# Patient Record
Sex: Female | Born: 1950 | Race: Black or African American | Hispanic: No | Smoking: Former smoker
Health system: Southern US, Community
[De-identification: ages and names within clinical notes are randomized; demographics above are authoritative.]

## PROBLEM LIST (undated history)

## (undated) DIAGNOSIS — E669 Obesity, unspecified: Secondary | ICD-10-CM

## (undated) DIAGNOSIS — E119 Type 2 diabetes mellitus without complications: Secondary | ICD-10-CM

## (undated) DIAGNOSIS — M1990 Unspecified osteoarthritis, unspecified site: Secondary | ICD-10-CM

## (undated) DIAGNOSIS — E7800 Pure hypercholesterolemia, unspecified: Secondary | ICD-10-CM

## (undated) DIAGNOSIS — I10 Essential (primary) hypertension: Secondary | ICD-10-CM

## (undated) DIAGNOSIS — L309 Dermatitis, unspecified: Secondary | ICD-10-CM

## (undated) HISTORY — PX: SHOULDER ARTHROTOMY: SUR111

## (undated) HISTORY — PX: TUBAL LIGATION: SHX77

## (undated) HISTORY — PX: TONSILLECTOMY: SUR1361

---

## 2005-10-03 ENCOUNTER — Ambulatory Visit: Payer: Self-pay

## 2005-11-07 ENCOUNTER — Ambulatory Visit: Payer: Self-pay

## 2007-05-12 IMAGING — CR DG KNEE 1-2V*L*
1 series · 4 of 4 positions shown · non-contrast
Comparison: none

REASON FOR EXAM: Pain
                     DDS  Fax report to: [REDACTED]
COMMENTS:

PROCEDURE:     DXR - DXR KNEE LEFT AP AND LATERAL  - October 03, 2005  [DATE]
RESULT:     No acute bony or joint abnormality is identified. There is no
evidence of fracture or dislocation.

[Series 1: view not recorded · 0.17mm/px · 4 of 4 slices shown]
[im 1/4]
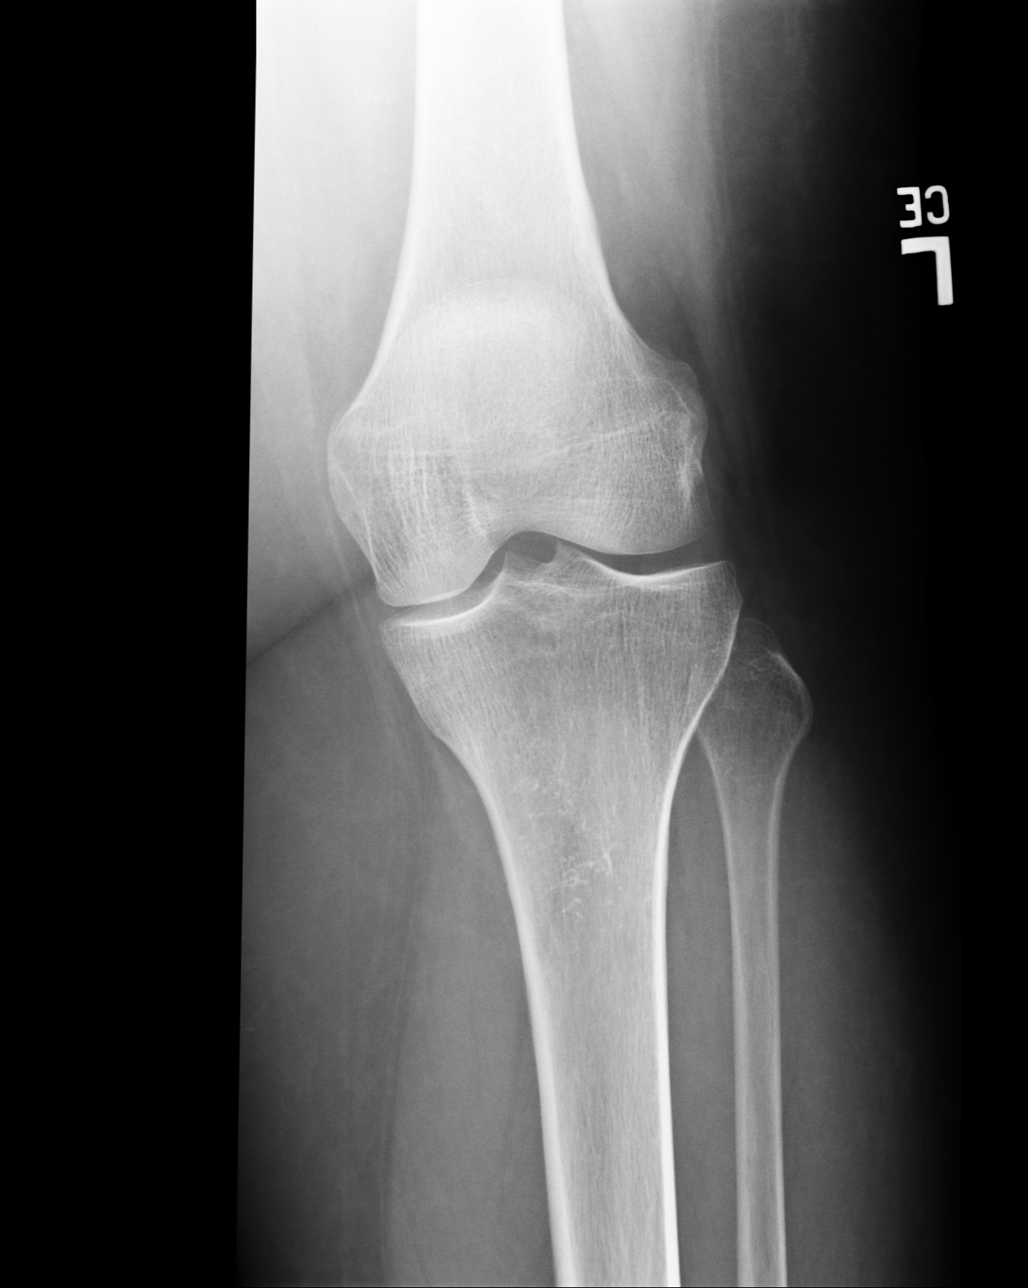
[im 2/4]
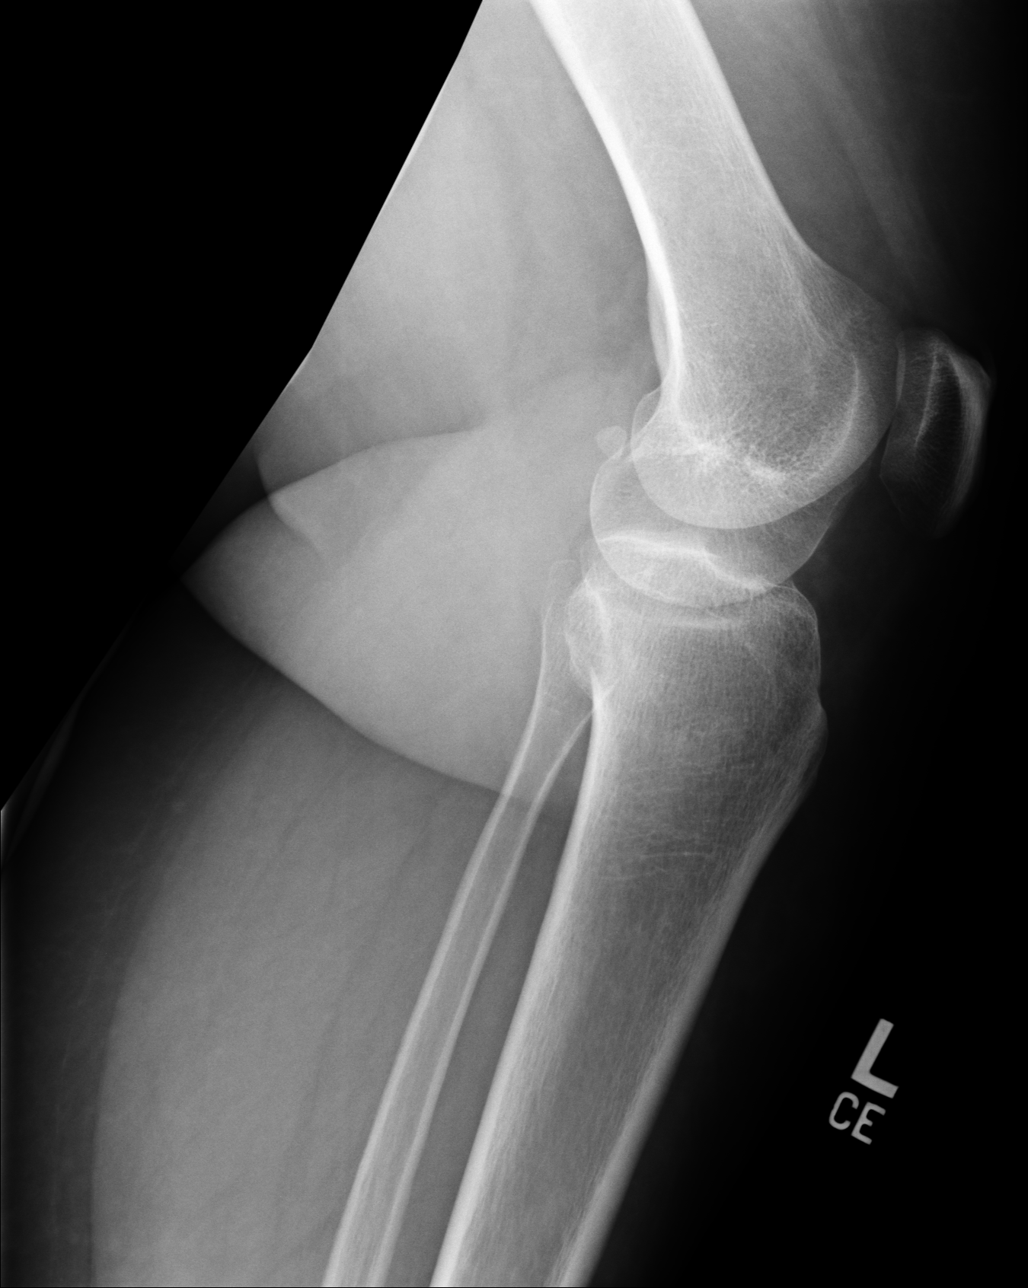
[im 3/4]
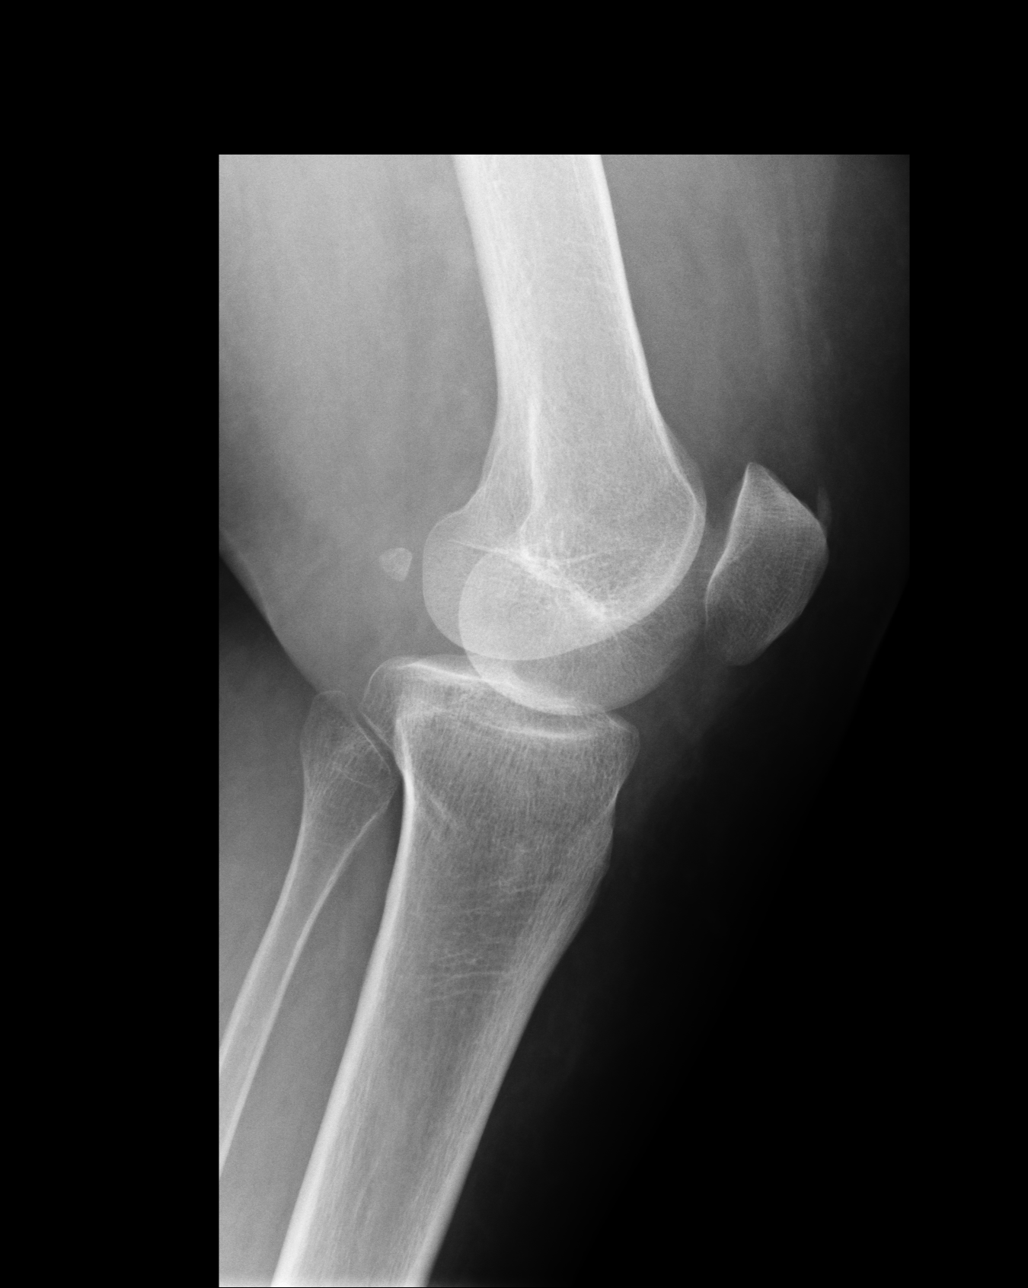
[im 4/4]
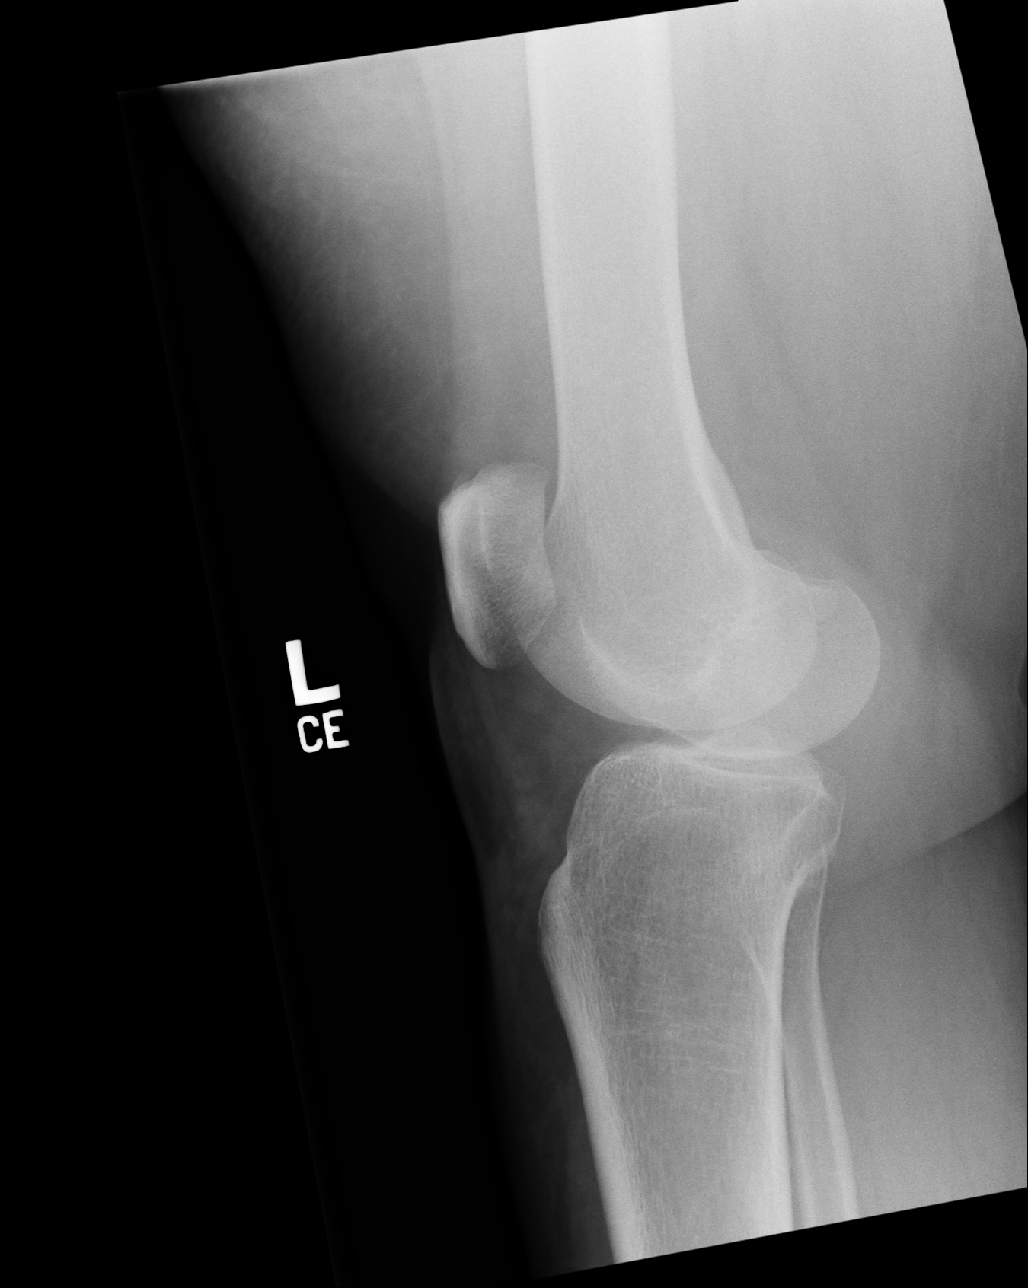

[4 of 4 positions shown; findings below may reference images not displayed]

IMPRESSION: No acute abnormality.

## 2007-06-16 IMAGING — CR RIGHT HIP - COMPLETE 2+ VIEW
1 series · 3 of 3 positions shown · non-contrast
Comparison: none

REASON FOR EXAM: xray right hip pain
COMMENTS:

PROCEDURE:     DXR - DXR HIP RIGHT COMPLETE  - November 07, 2005 [DATE]
RESULT:     AP and lateral views of the RIGHT hip show no fracture,
dislocation or other acute bony abnormality.

[Series 1: view not recorded · 0.17mm/px · 3 of 3 slices shown]
[im 1/3]
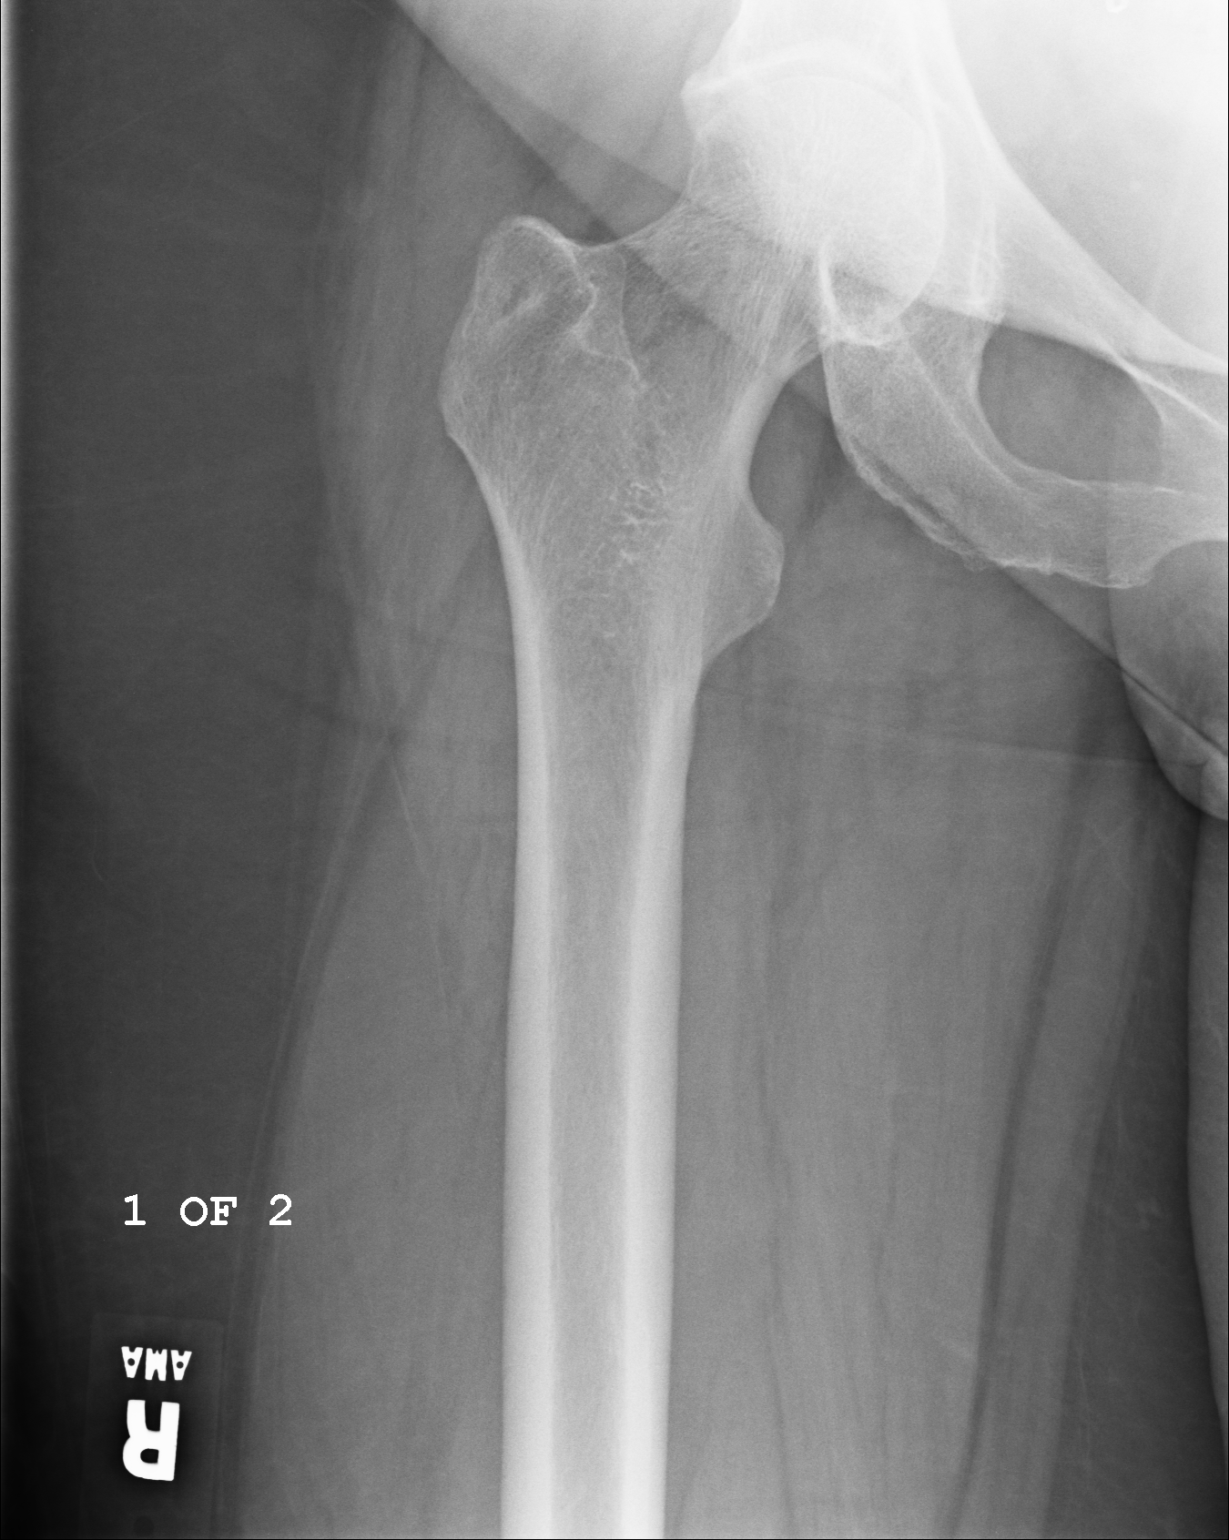
[im 2/3]
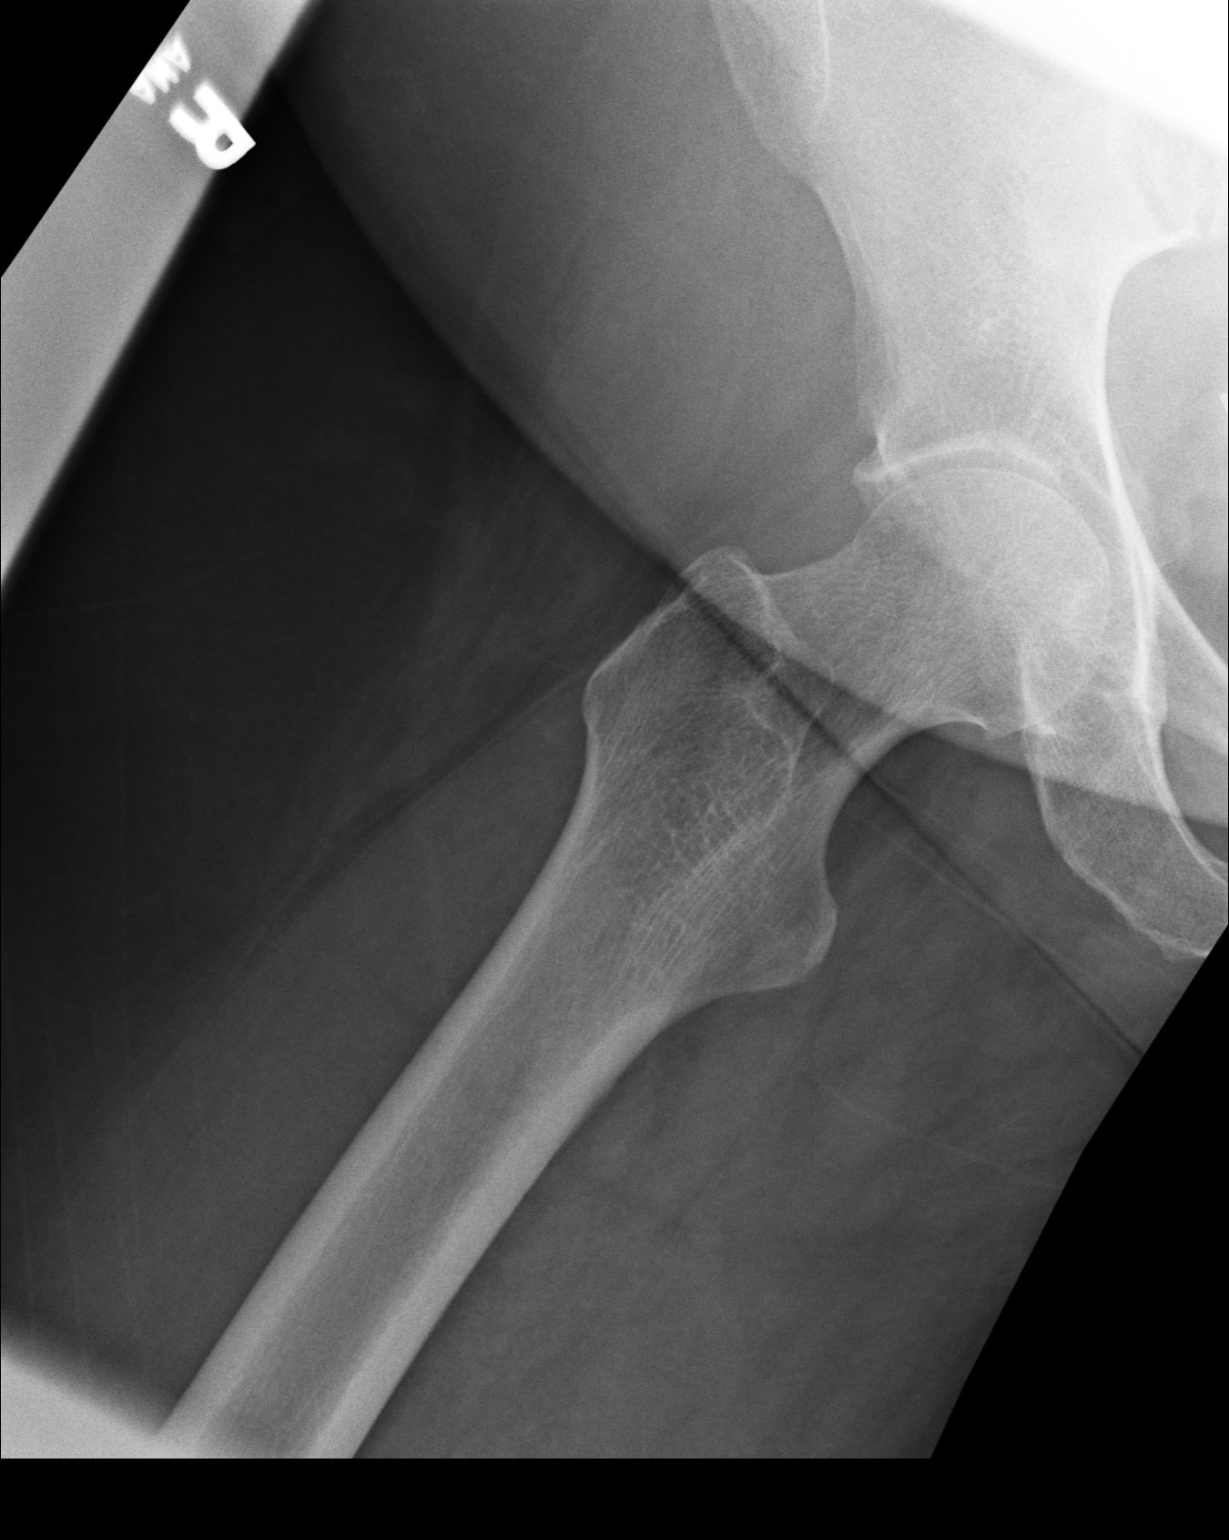
[im 3/3]
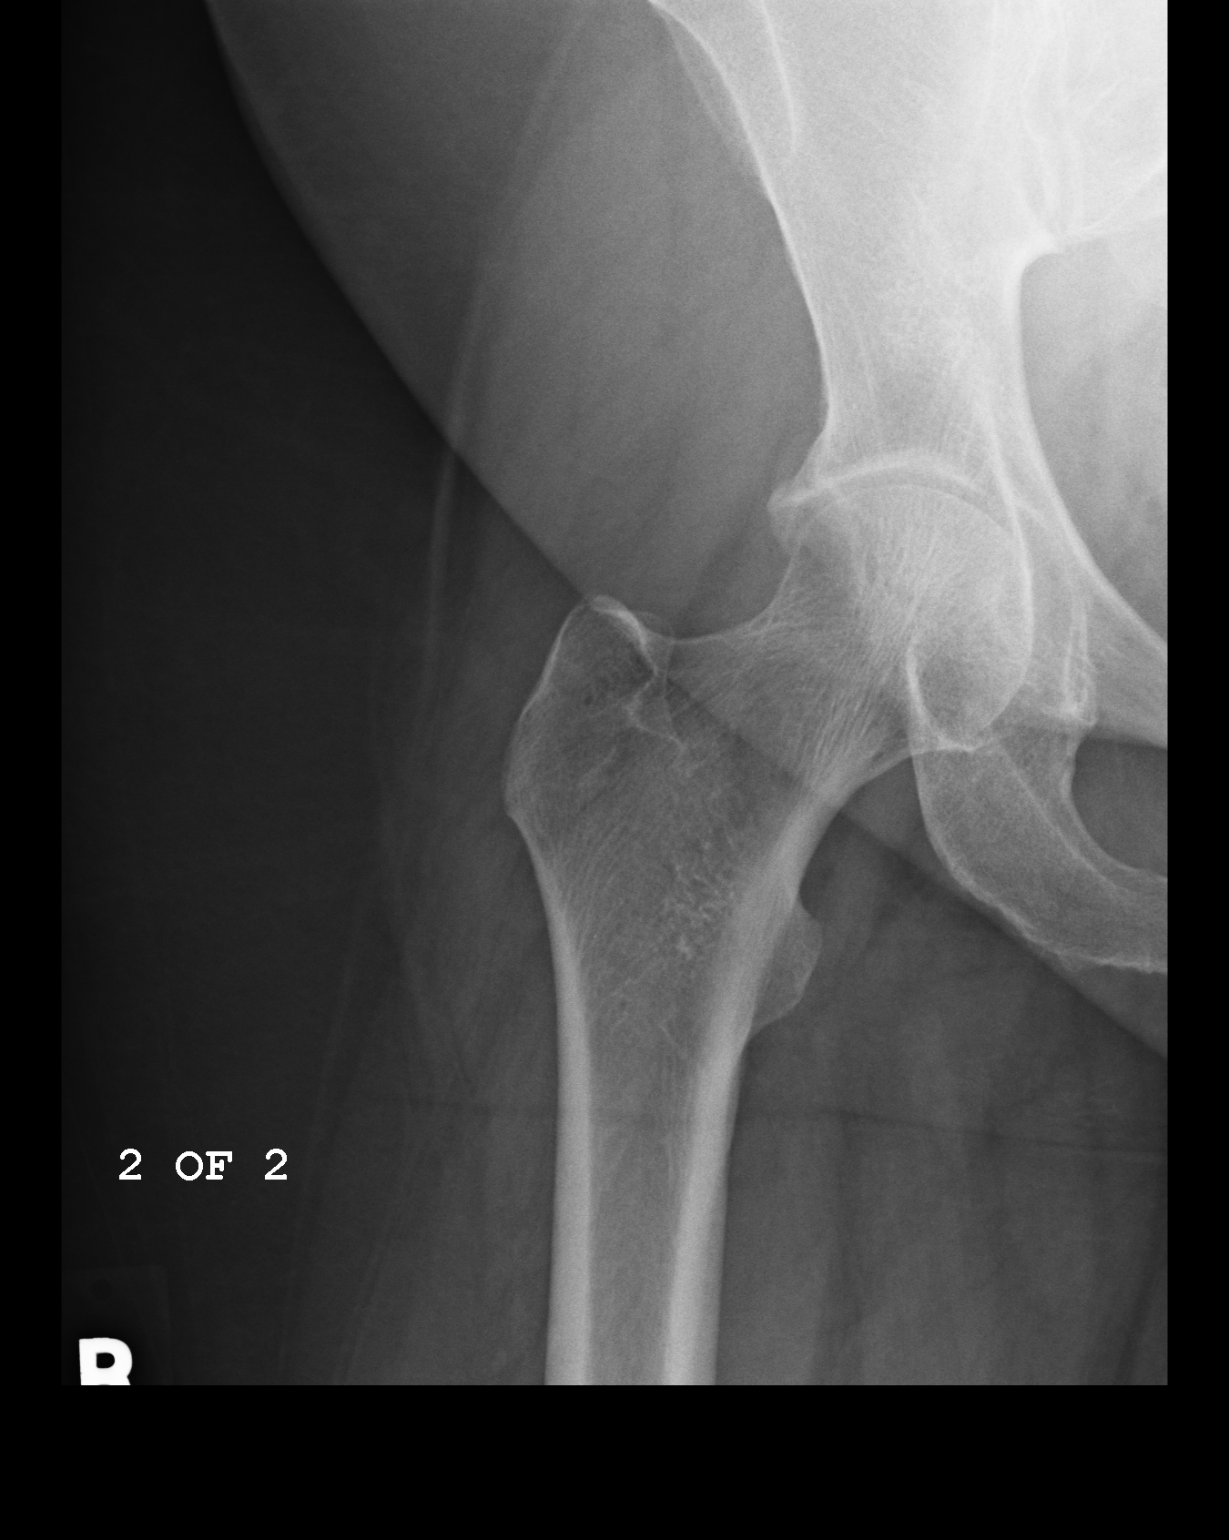

[3 of 3 positions shown; findings below may reference images not displayed]

IMPRESSION: 1)No significant osseous abnormalities are identified.

## 2007-06-16 IMAGING — CR DG HIP COMPLETE 2+V*L*
1 series · 2 of 2 positions shown · non-contrast
Comparison: none

REASON FOR EXAM: Left hip pain problem  dds [phone] ext
[id number]
COMMENTS:

PROCEDURE:     DXR - DXR HIP LEFT COMPLETE  - November 07, 2005 [DATE]
RESULT:        Two views of the LEFT hip show no fracture, dislocation or
other acute bony abnormality. The hip joint space is well maintained.  No
lytic or blastic lesions are seen.

[Series 1: view not recorded · 0.17mm/px · 2 of 2 slices shown]
[im 1/2]
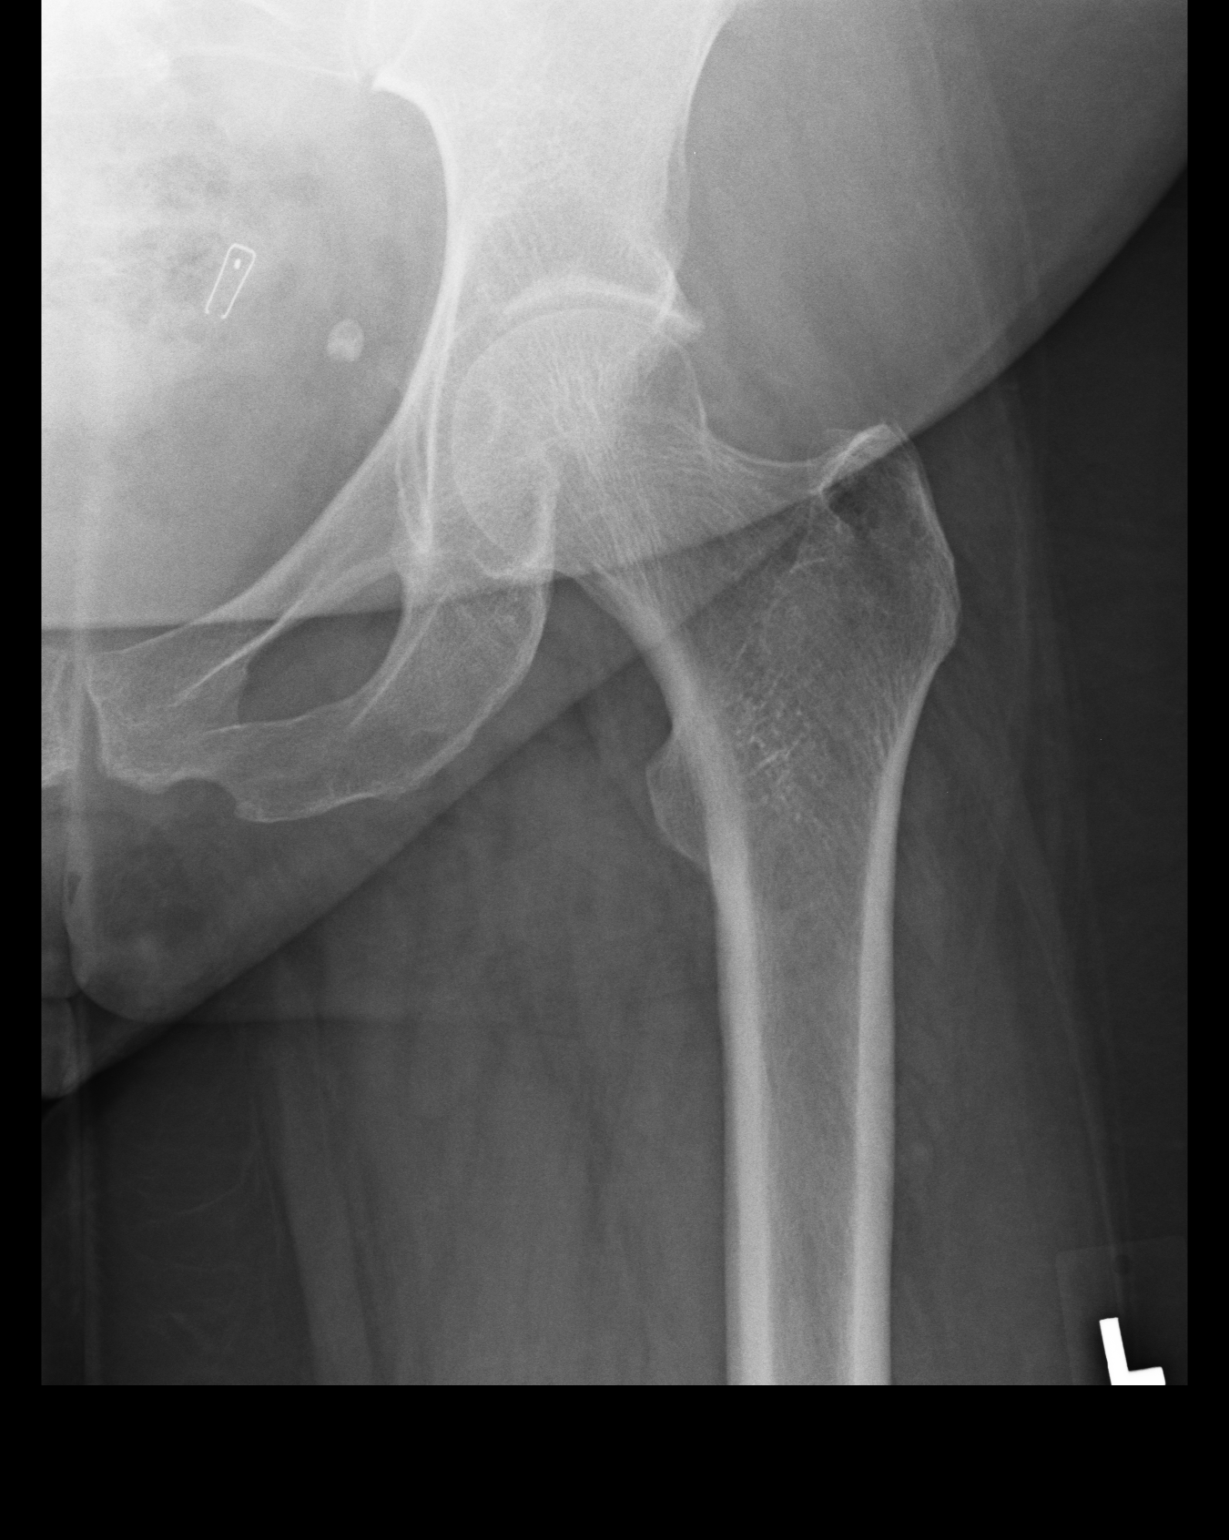
[im 2/2]
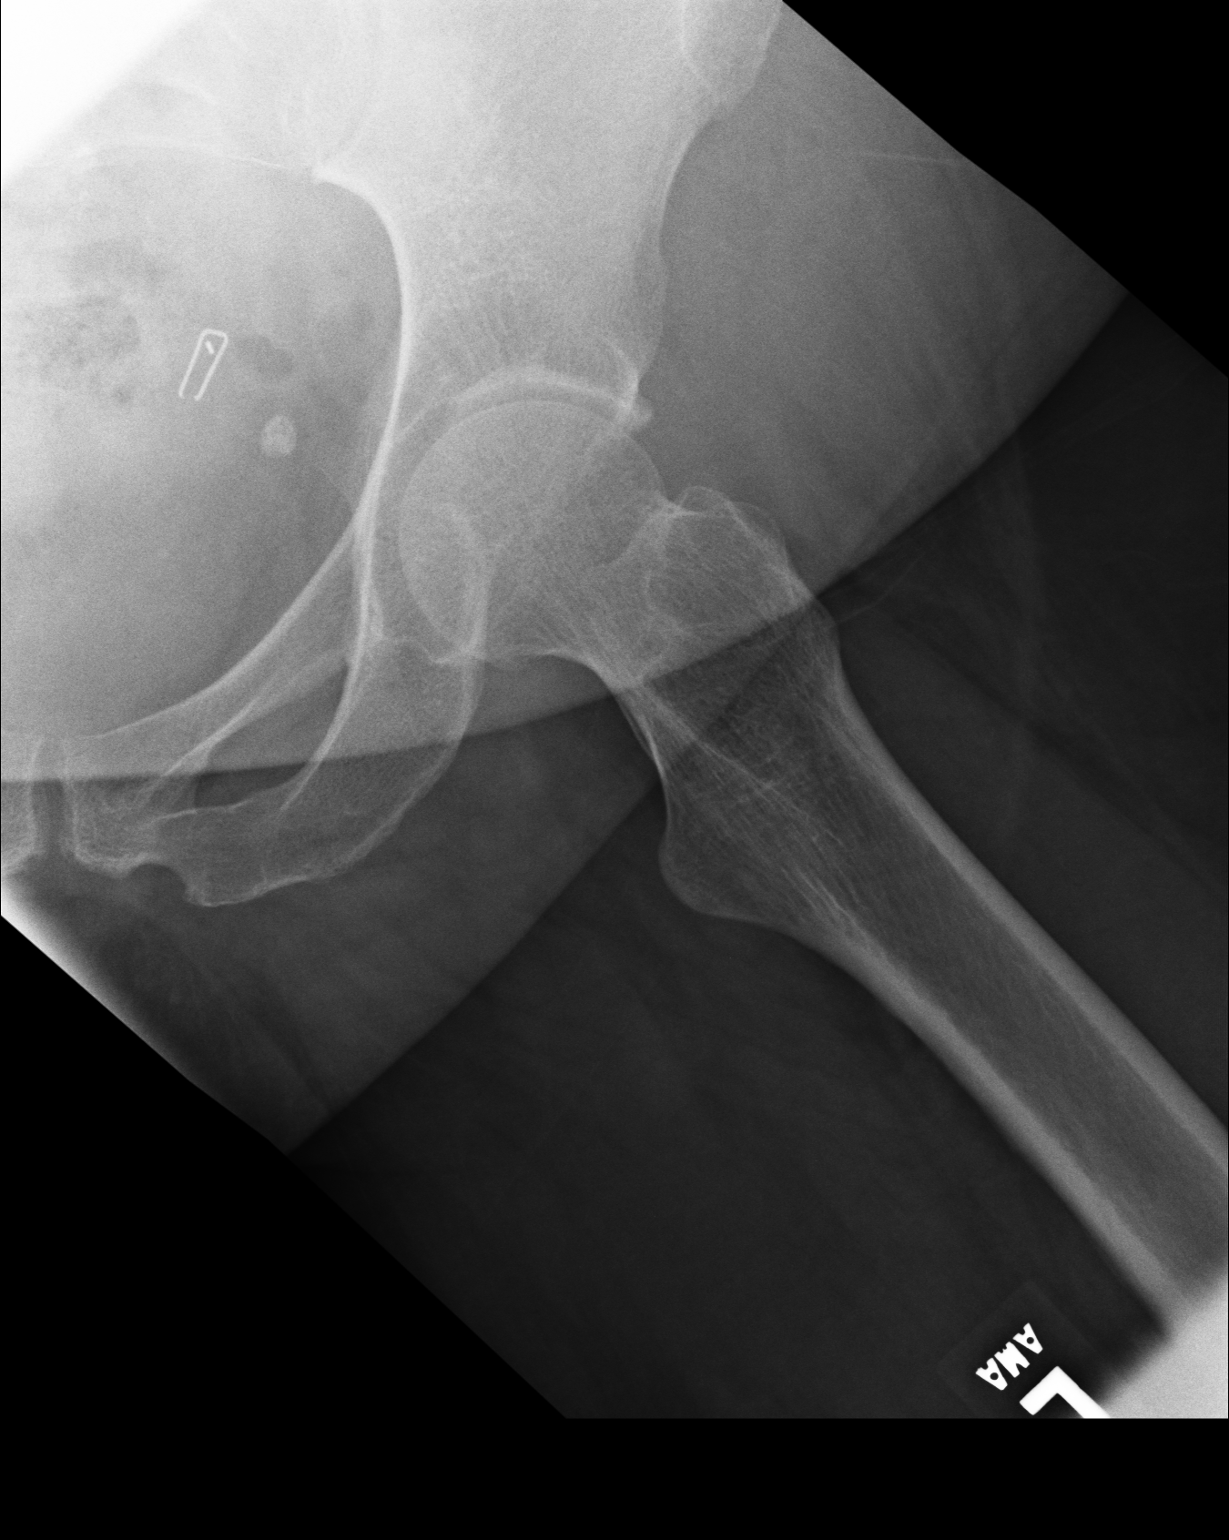

[2 of 2 positions shown; findings below may reference images not displayed]

IMPRESSION: No significant abnormalities are identified.

## 2015-02-12 ENCOUNTER — Encounter: Payer: Self-pay | Admitting: Emergency Medicine

## 2015-02-12 ENCOUNTER — Emergency Department
Admission: EM | Admit: 2015-02-12 | Discharge: 2015-02-12 | Disposition: A | Discharge status: Home / Self Care | Payer: Medicare Other | Attending: Emergency Medicine | Admitting: Emergency Medicine

## 2015-02-12 DIAGNOSIS — Y9289 Other specified places as the place of occurrence of the external cause: Secondary | ICD-10-CM | POA: Insufficient documentation

## 2015-02-12 DIAGNOSIS — Y9389 Activity, other specified: Secondary | ICD-10-CM | POA: Diagnosis not present

## 2015-02-12 DIAGNOSIS — Z880 Allergy status to penicillin: Secondary | ICD-10-CM | POA: Diagnosis not present

## 2015-02-12 DIAGNOSIS — I10 Essential (primary) hypertension: Secondary | ICD-10-CM | POA: Insufficient documentation

## 2015-02-12 DIAGNOSIS — Y998 Other external cause status: Secondary | ICD-10-CM | POA: Insufficient documentation

## 2015-02-12 DIAGNOSIS — S91112A Laceration without foreign body of left great toe without damage to nail, initial encounter: Secondary | ICD-10-CM | POA: Insufficient documentation

## 2015-02-12 DIAGNOSIS — W268XXA Contact with other sharp object(s), not elsewhere classified, initial encounter: Secondary | ICD-10-CM | POA: Diagnosis not present

## 2015-02-12 DIAGNOSIS — E119 Type 2 diabetes mellitus without complications: Secondary | ICD-10-CM | POA: Insufficient documentation

## 2015-02-12 DIAGNOSIS — Z87891 Personal history of nicotine dependence: Secondary | ICD-10-CM | POA: Insufficient documentation

## 2015-02-12 DIAGNOSIS — S91312A Laceration without foreign body, left foot, initial encounter: Secondary | ICD-10-CM

## 2015-02-12 HISTORY — DX: Obesity, unspecified: E66.9

## 2015-02-12 HISTORY — DX: Dermatitis, unspecified: L30.9

## 2015-02-12 HISTORY — DX: Pure hypercholesterolemia, unspecified: E78.00

## 2015-02-12 HISTORY — DX: Essential (primary) hypertension: I10

## 2015-02-12 HISTORY — DX: Type 2 diabetes mellitus without complications: E11.9

## 2015-02-12 HISTORY — DX: Unspecified osteoarthritis, unspecified site: M19.90

## 2015-02-12 NOTE — ED Provider Notes (Signed)
[facility] Emergency Department Provider Note  ____________________________________________  Time seen: Approximately 8:13 PM  I have reviewed the triage vital signs and the nursing notes.   HISTORY  Chief Complaint Extremity Laceration    HPI Meredith Galloway is a 64 y.o. female who presents emergency department complaining of laceration to her left foot. She states that she was cutting off her callouses with fingernail clippers and she cut to deep causing bleeding. She states that she is diabetic and was concerned about healing to this area. She reports that bleeding was stopped prior to arrival. She denies any diabetic neuropathy in her feet. She denies any other injury or symptoms at this time.      Past Medical History  Diagnosis Date  . Diabetes mellitus without complication (HCC)   . Hypertension   . Arthritis   . Obesity   . Eczema   . Hypercholesteremia     There are no active problems to display for this patient.   Past Surgical History  Procedure Laterality Date  . Tonsillectomy    . Tubal ligation    . Shoulder arthrotomy      No current outpatient prescriptions on file.  Allergies Actos; Ciprofloxacin; Cymbalta; Meloxicam; Metformin and related; Penicillins; and Sulfa antibiotics  History reviewed. No pertinent family history.  Social History Social History  Substance Use Topics  . Smoking status: Former Games developer  . Smokeless tobacco: None  . Alcohol Use: No    Review of Systems Constitutional: No fever/chills Eyes: No visual changes. ENT: No sore throat. Cardiovascular: Denies chest pain. Respiratory: Denies shortness of breath. Gastrointestinal: No abdominal pain.  No nausea, no vomiting.  No diarrhea.  No constipation. Genitourinary: Negative for dysuria. Musculoskeletal: Negative for back pain. Skin: Negative for rash. Endorses laceration to left foot. Neurological: Negative for headaches, focal weakness or  numbness.  10-point ROS otherwise negative.  ____________________________________________   PHYSICAL EXAM:  VITAL SIGNS: ED Triage Vitals  Enc Vitals Group     BP 02/12/15 2010 123/77 mmHg     Pulse Rate 02/12/15 2010 95     Resp 02/12/15 2010 18     Temp 02/12/15 2010 98.1 F (36.7 C)     Temp Source 02/12/15 2010 Oral     SpO2 02/12/15 2010 100 %     Weight 02/12/15 2010 222 lb (100.699 kg)     Height 02/12/15 2010  (1.626 m)     Head Cir --      Peak Flow --      Pain Score 02/12/15 2011 8     Pain Loc --      Pain Edu? --      Excl. in GC? --     Constitutional: Alert and oriented. Well appearing and in no acute distress. Eyes: Conjunctivae are normal. PERRL. EOMI. Head: Atraumatic. Nose: No congestion/rhinnorhea. Mouth/Throat: Mucous membranes are moist.  Oropharynx non-erythematous. Neck: No stridor.   Cardiovascular: Normal rate, regular rhythm. Grossly normal heart sounds.  Good peripheral circulation. Respiratory: Normal respiratory effort.  No retractions. Lungs CTAB. Gastrointestinal: Soft and nontender. No distention. No abdominal bruits. No CVA tenderness. Musculoskeletal: No lower extremity tenderness nor edema.  No joint effusions. Neurologic:  Normal speech and language. No gross focal neurologic deficits are appreciated. No gait instability. Skin:  Skin is warm, dry and intact. No rash noted. small superficial laceration noted to medial left foot over the MCP joint first toe. Area is only into the dermal layer. No subcutaneous  layer involvement. This measures approximately 0.5 cm in length.  Psychiatric: Mood and affect are normal. Speech and behavior are normal.  ____________________________________________   LABS (all labs ordered are listed, but only abnormal results are displayed)  Labs Reviewed - No data to  display ____________________________________________  EKG   ____________________________________________  RADIOLOGY   ____________________________________________   PROCEDURES  Procedure(s) performed: Yes, wound repair, see procedure note(s).   LACERATION REPAIR Performed by: Racheal PatchesJonathan D Jahree Dermody Authorized by: Delorise RoyalsJonathan D Marimar Suber Consent: Verbal consent obtained. Risks and benefits: risks, benefits and alternatives were discussed Consent given by: patient Patient identity confirmed: provided demographic data Prepped and Draped in normal sterile fashion Wound explored  Laceration Location: Left medial foot, over MCP joint first digit  Laceration Length: 0.5 cm  No Foreign Bodies seen or palpated  Irrigation method: syringe Amount of cleaning: standard  Skin closure: Dermabond   Technique: Area is a superficial cut to the left inner foot. This was sealed using Dermabond. No cough medications*procedure. Area was thoroughly cleansed prior to application.   Patient tolerance: Patient tolerated the procedure well with no immediate complications.   Critical Care performed: No  ____________________________________________   INITIAL IMPRESSION / ASSESSMENT AND PLAN / ED COURSE  Pertinent labs & imaging results that were available during my care of the patient were reviewed by me and considered in my medical decision making (see chart for details).   Patient reports to the emergency department with a laceration to the left medial foot. This was superficial in nature and closed using Dermabond. Patient given strict ED precautions to return for worsening of symptoms due to her known diabetic status. Patient verbalizes understanding of diagnosis and treatment plan and verbalizes compliance with same. ____________________________________________   FINAL CLINICAL IMPRESSION(S) / ED DIAGNOSES  Final diagnoses:  Foot laceration, left, initial encounter      Racheal PatchesJonathan  D Dinero Chavira, PA-C 02/12/15 2024  Arnaldo NatalPaul F Malinda, MD 02/12/15 2322

## 2015-02-12 NOTE — ED Notes (Signed)
Pt states she was cutting her toe nails and calluses and cut too deep and it started bleeding and wouldn't stop and pt has dm and is worried of having infection

## 2015-02-12 NOTE — Discharge Instructions (Signed)
Stitches, Staples, or Adhesive Wound Closure  °Health care providers use stitches (sutures), staples, and certain glue (skin adhesives) to hold skin together while it heals (wound closure). You may need this treatment after you have surgery or if you cut your skin accidentally. These methods help your skin to heal more quickly and make it less likely that you will have a scar. A wound may take several months to heal completely.  °The type of wound you have determines when your wound gets closed. In most cases, the wound is closed as soon as possible (primary skin closure). Sometimes, closure is delayed so the wound can be cleaned and allowed to heal naturally. This reduces the chance of infection. Delayed closure may be needed if your wound:  °Is caused by a bite.  °Happened more than 6 hours ago.  °Involves loss of skin or the tissues under the skin.  °Has dirt or debris in it that cannot be removed.  °Is infected. °WHAT ARE THE DIFFERENT KINDS OF WOUND CLOSURES?  °There are many options for wound closure. The one that your health care provider uses depends on how deep and how large your wound is.  °Adhesive Glue  °To use this type of glue to close a wound, your health care provider holds the edges of the wound together and paints the glue on the surface of your skin. You may need more than one layer of glue. Then the wound may be covered with a light bandage (dressing).  °This type of skin closure may be used for small wounds that are not deep (superficial). Using glue for wound closure is less painful than other methods. It does not require a medicine that numbs the area (local anesthetic). This method also leaves nothing to be removed. Adhesive glue is often used for children and on facial wounds.  °Adhesive glue cannot be used for wounds that are deep, uneven, or bleeding. It is not used inside of a wound.  °Adhesive Strips  °These strips are made of sticky (adhesive), porous paper. They are applied across your  skin edges like a regular adhesive bandage. You leave them on until they fall off.  °Adhesive strips may be used to close very superficial wounds. They may also be used along with sutures to improve the closure of your skin edges.  °Sutures  °Sutures are the oldest method of wound closure. Sutures can be made from natural substances, such as silk, or from synthetic materials, such as nylon and steel. They can be made from a material that your body can break down as your wound heals (absorbable), or they can be made from a material that needs to be removed from your skin (nonabsorbable). They come in many different strengths and sizes.  °Your health care provider attaches the sutures to a steel needle on one end. Sutures can be passed through your skin, or through the tissues beneath your skin. Then they are tied and cut. Your skin edges may be closed in one continuous stitch or in separate stitches.  °Sutures are strong and can be used for all kinds of wounds. Absorbable sutures may be used to close tissues under the skin. The disadvantage of sutures is that they may cause skin reactions that lead to infection. Nonabsorbable sutures need to be removed.  °Staples  °When surgical staples are used to close a wound, the edges of your skin on both sides of the wound are brought close together. A staple is placed across the wound, and   an instrument secures the edges together. Staples are often used to close surgical cuts (incisions).  °Staples are faster to use than sutures, and they cause less skin reaction. Staples need to be removed using a tool that bends the staples away from your skin.  °HOW DO I CARE FOR MY WOUND CLOSURE?  °Take medicines only as directed by your health care provider.  °If you were prescribed an antibiotic medicine for your wound, finish it all even if you start to feel better.  °Use ointments or creams only as directed by your health care provider.  °Wash your hands with soap and water before and  after touching your wound.  °Do not soak your wound in water. Do not take baths, swim, or use a hot tub until your health care provider approves.  °Ask your health care provider when you can start showering. Cover your wound if directed by your health care provider.  °Do not take out your own sutures or staples.  °Do not pick at your wound. Picking can cause an infection.  °Keep all follow-up visits as directed by your health care provider. This is important. °HOW LONG WILL I HAVE MY WOUND CLOSURE?  °Leave adhesive glue on your skin until the glue peels away.  °Leave adhesive strips on your skin until the strips fall off.  °Absorbable sutures will dissolve within several days.  °Nonabsorbable sutures and staples must be removed. The location of the wound will determine how long they stay in. This can range from several days to a couple of weeks. °WHEN SHOULD I SEEK HELP FOR MY WOUND CLOSURE?  °Contact your health care provider if:  °You have a fever.  °You have chills.  °You have drainage, redness, swelling, or pain at your wound.  °There is a bad smell coming from your wound.  °The skin edges of your wound start to separate after your sutures have been removed.  °Your wound becomes thick, raised, and darker in color after your sutures come out (scarring). °This information is not intended to replace advice given to you by your health care provider. Make sure you discuss any questions you have with your health care provider.  °Document Released: 11/06/2000 Document Revised: 03/04/2014 Document Reviewed: 07/21/2013  °[employer] Interactive Patient Education ©2016 [employer]  ° °
